# Patient Record
Sex: Female | Born: 1975 | Race: White | Hispanic: No | Marital: Married | Smoking: Never smoker
Health system: Southern US, Community
[De-identification: ages and names within clinical notes are randomized; demographics above are authoritative.]

## PROBLEM LIST (undated history)

## (undated) HISTORY — PX: TONSILLECTOMY: SUR1361

---

## 2015-12-01 HISTORY — PX: ABDOMINAL HYSTERECTOMY: SHX81

## 2018-11-21 HISTORY — PX: ESOPHAGOGASTRODUODENOSCOPY: SHX1529

## 2018-12-31 HISTORY — PX: CHOLECYSTECTOMY: SHX55

## 2019-09-07 ENCOUNTER — Encounter: Payer: Self-pay | Admitting: Gastroenterology

## 2019-09-18 ENCOUNTER — Ambulatory Visit: Payer: Self-pay | Admitting: Gastroenterology

## 2019-09-27 NOTE — Progress Notes (Signed)
Referring Provider: Marylee Floras, FNP Primary Care Physician:  Marylee Floras, FNP Primary Gastroenterologist:  Dr. Oneida Alar  Chief Complaint  Patient presents with  . Diarrhea    anytime she eats food. Gallbladder removed in Feb 2020 and since this has been happening daily  . Nausea    "all the time"    HPI:   Marie Tyler is a 43 y.o. female presenting today at the request of Marylee Floras, FNP for chronic diarrhea and nausea.   Reviewed referral notes: EGD in December 2019 with Dr. Earley Brooke.  Surgical report with findings including a few ulcers in the GE junction s/p biopsies, normal mucosa within the stomach, normal mucosa within the duodenum s/p random mucosal biopsies. Pathology report with duodenal biopsy revealing mild chronic duodenitis without additional pathologic change, EG junction biopsy revealed reflux esophagitis without evidence of Barrett's.  Microscopic description reports infiltrating eosinophils but does not state the amount per hpf along with usual mononuclear cell infiltrate.   Laparoscopic cholecystectomy on 01/17/2019 at [facility] in [place], [place] It appears CBC, CMP, TSH, stool culture, C. diff, and O&P stool were ordered at PCP visit on 09/06/19, but I did not receive the results.   Today she states she has been having nausea and diarrhea for several months.  Started prior to December 2019.  She underwent EGD in 2019 for further evaluation and was told she had an ulcer.  She was started on omeprazole 20 mg and Carafate.  Her nausea did not improve.she reports having an ultrasound that showed gallstones and ultimately underwent cholecystectomy in February 2020. Since cholecystectomy, she is having worsening postprandial diarrhea. 3-4 BMs daily, [place] 5-7, within 20 min of eating. Happens with any foods. Urgent. Not taking anything for diarrhea. Tried imodium which would stop her up for a few days. No blood in the stool. No black stools. Yellow to orange at  times. No nocturnal stools. Has tried glutin free foods. This did not help. Caused worsening diarrhea. Reports completing prior stool studies that were negative. She has another set of stool studies she is supposed to complete. No antibiotics or hospitalizations prior to onset of diarrhea.   Nausea has not improved since her cholecystectomy. She is nauseated whether she eats or not. Eating will make it worse. Also reporting bloating and gas. No weight loss. Reflux maybe once a month. No heartburn or indigestion. One episode of vomiting in early October. No hematemesis. No coffee ground appearance. No abdominal pain. Just bloating. States she has some pain when pressing on her RLQ. Present since 2017 after hysterectomy. Not changing. States she thinks they got her up too soon and thought she felt something pull. Feels like she is always full. Eats very small meals. States she could go days without eating and it not bother her. Occasional dysphagia to large pills. Especially vitamins. Only once every 6 months. No trouble with solid foods.   Eats steak and salad frequently. Trying to stay away from fried fatty foods. No sodas. Eats broccoli often.  Used to take ibuprofen, 800 mg regularly. Stopped about 1 year ago. No goody powders. Rare Aleve.   History reviewed. No pertinent past medical history.  Past Surgical History:  Procedure Laterality Date  . ABDOMINAL HYSTERECTOMY  12/2015  . CHOLECYSTECTOMY  12/2018  . TONSILLECTOMY      Current Outpatient Medications  Medication Sig Dispense Refill  . sucralfate (CARAFATE) 1 g tablet Take 1 g by mouth as needed.    Marland Kitchen  cholestyramine (QUESTRAN) 4 g packet Take 1 packet (4 g total) by mouth 3 (three) times daily with meals. 90 each 5  . pantoprazole (PROTONIX) 40 MG tablet Take 1 tablet (40 mg total) by mouth 2 (two) times daily before a meal. 60 tablet 5   No current facility-administered medications for this visit.     Allergies as of 09/28/2019  .  (No Known Allergies)    Family History  Problem Relation Age of Onset  . Colon cancer Neg Hx     Social History   Socioeconomic History  . Marital status: Married    Spouse name: Not on file  . Number of children: Not on file  . Years of education: Not on file  . Highest education level: Not on file  Occupational History  . Not on file  Social Needs  . Financial resource strain: Not on file  . Food insecurity    Worry: Not on file    Inability: Not on file  . Transportation needs    Medical: Not on file    Non-medical: Not on file  Tobacco Use  . Smoking status: Never Smoker  . Smokeless tobacco: Never Used  Substance and Sexual Activity  . Alcohol use: Yes    Comment: rare  . Drug use: Never  . Sexual activity: Yes  Lifestyle  . Physical activity    Days per week: Not on file    Minutes per session: Not on file  . Stress: Not on file  Relationships  . Social Musicianconnections    Talks on phone: Not on file    Gets together: Not on file    Attends religious service: Not on file    Active member of club or organization: Not on file    Attends meetings of clubs or organizations: Not on file    Relationship status: Not on file  . Intimate partner violence    Fear of current or ex partner: Not on file    Emotionally abused: Not on file    Physically abused: Not on file    Forced sexual activity: Not on file  Other Topics Concern  . Not on file  Social History Narrative  . Not on file    Review of Systems: Gen: Denies any fever, chills, lightheadedness, dizziness, or feeling like she will pass out. CV: Denies chest pain, heart palpitations Resp: Denies shortness of breath or cough GI: See HPI GU : Denies urinary burning, urinary frequency, urinary hesitancy MS: Right knee pain (horse injury) and left thumb pain (prior dislocation)  Derm: Denies rash Psych: Denies depression, anxiety Heme: Denies bruising, bleeding  Physical Exam: BP 123/82   Pulse 85    Temp 97.6 F (36.4 C) (Oral)   Ht 5\' 6"  (1.676 m)   Wt 258 lb 9.6 oz (117.3 kg)   BMI 41.74 kg/m  General:   Alert and oriented. Pleasant and cooperative. Well-nourished and well-developed.  Head:  Normocephalic and atraumatic. Eyes:  Without icterus, sclera clear and conjunctiva pink.  Ears:  Normal auditory acuity. Nose:  No deformity, discharge,  or lesions. Lungs:  Clear to auscultation bilaterally. No wheezes, rales, or rhonchi. No distress.  Heart:  S1, S2 present without murmurs appreciated.  Abdomen:  +BS, soft, and non-distended.  Minimal tenderness to palpation in the right lower quadrant.  No HSM noted. No guarding or rebound. No masses appreciated.  Rectal:  Deferred  Msk:  Symmetrical without gross deformities. Normal posture. Extremities:  Without edema.  Neurologic:  Alert and  oriented x4;  grossly normal neurologically. Skin:  Intact without significant lesions or rashes. Psych: Normal mood and affect.

## 2019-09-28 ENCOUNTER — Encounter: Payer: Self-pay | Admitting: Gastroenterology

## 2019-09-28 ENCOUNTER — Ambulatory Visit (INDEPENDENT_AMBULATORY_CARE_PROVIDER_SITE_OTHER): Payer: PRIVATE HEALTH INSURANCE | Admitting: Gastroenterology

## 2019-09-28 ENCOUNTER — Other Ambulatory Visit: Payer: Self-pay

## 2019-09-28 ENCOUNTER — Telehealth: Payer: Self-pay

## 2019-09-28 VITALS — BP 123/82 | HR 85 | Temp 97.6°F | Ht 66.0 in | Wt 258.6 lb

## 2019-09-28 DIAGNOSIS — R197 Diarrhea, unspecified: Secondary | ICD-10-CM | POA: Insufficient documentation

## 2019-09-28 DIAGNOSIS — R6881 Early satiety: Secondary | ICD-10-CM | POA: Diagnosis not present

## 2019-09-28 DIAGNOSIS — R140 Abdominal distension (gaseous): Secondary | ICD-10-CM

## 2019-09-28 DIAGNOSIS — R112 Nausea with vomiting, unspecified: Secondary | ICD-10-CM

## 2019-09-28 MED ORDER — CHOLESTYRAMINE 4 G PO PACK: 4.0000 g | PACK | Freq: Three times a day (TID) | ORAL | 5 refills | Status: AC

## 2019-09-28 MED ORDER — PANTOPRAZOLE SODIUM 40 MG PO TBEC: 40.0000 mg | DELAYED_RELEASE_TABLET | Freq: Two times a day (BID) | ORAL | 5 refills | Status: AC

## 2019-09-28 NOTE — Telephone Encounter (Signed)
GES scheduled for 10/12/19 at 8:00am, arrive at 7:45am. NPO after midnight before test and no stomach medications. Tried to call pt, no answer, left detailed message on VM to inform of appt. Letter mailed.

## 2019-09-28 NOTE — Assessment & Plan Note (Signed)
Addressed under nausea and vomiting.  

## 2019-09-28 NOTE — Telephone Encounter (Signed)
[place] to see if PA is needed for GES. LMOVM for [employer].

## 2019-09-28 NOTE — Assessment & Plan Note (Signed)
Suspect bloating may be related to dietary intake as patient consumes broccoli and salads frequently.  Avoid gas producing foods, specifically broccoli and salads at this time.  Handout provided for additional recommendations. Add daily probiotic. Follow-up in 3 months.

## 2019-09-28 NOTE — Patient Instructions (Addendum)
Please stop omeprazole and start Protonix 40 mg twice daily. Take this 30 minutes before breakfast and dinner.   Avoid NSAIDs. This includes ibuprofen, Advil, Aleve, and Goody Powders.   Follow GERD diet. See handout below.   We will get you scheduled for a gastric emptying study to help evaluate your nausea and constant full feeling.   I am sending in Questran for your diarrhea. You will take 4g three times daily with meals. You may decrease frequency if needed.   You may add a probiotic daily to see if this helps with bloating.   See bloating handout below for food/drinks to avoid.   Follow-up in 3 months. Call if questions or concerns prior.   Ermalinda Memos, PA-C [facility]    Abdominal Bloating When you have abdominal bloating, your abdomen may feel full, tight, or painful. It may also look bigger than normal or swollen (distended). Common causes of abdominal bloating include:  Swallowing air.  Constipation.  Problems digesting food.  Eating too much.  Irritable bowel syndrome. This is a condition that affects the large intestine.  Lactose intolerance. This is an inability to digest lactose, a natural sugar in dairy products.  Celiac disease. This is a condition that affects the ability to digest gluten, a protein found in some grains.  Gastroparesis. This is a condition that slows down the movement of food in the stomach and small intestine. It is more common in people with diabetes mellitus.  Gastroesophageal reflux disease (GERD). This is a digestive condition that makes stomach acid flow back into the esophagus.  Urinary retention. This means that the body is holding onto urine, and the bladder cannot be emptied all the way. Follow these instructions at home: Eating and drinking  Avoid eating too much.  Try not to swallow air while talking or eating.  Avoid eating while lying down.  Avoid these foods and drinks: ? Foods that cause gas,  such as broccoli, cabbage, cauliflower, and baked beans. ? Carbonated drinks. ? Hard candy. ? Chewing gum. Medicines  Take over-the-counter and prescription medicines only as told by your health care provider.  Take probiotic medicines. These medicines contain live bacteria or yeasts that can help digestion.  Take coated peppermint oil capsules. Activity  Try to exercise regularly. Exercise may help to relieve bloating that is caused by gas and relieve constipation. General instructions  Keep all follow-up visits as told by your health care provider. This is important. Contact a health care provider if:  You have nausea and vomiting.  You have diarrhea.  You have abdominal pain.  You have unusual weight loss or weight gain.  You have severe pain, and medicines do not help. Get help right away if:  You have severe chest pain.  You have trouble breathing.  You have shortness of breath.  You have trouble urinating.  You have darker urine than normal.  You have blood in your stools or have dark, tarry stools. Summary  Abdominal bloating means that the abdomen is swollen.  Common causes of abdominal bloating are swallowing air, constipation, and problems digesting food.  Avoid eating too much and avoid swallowing air.  Avoid foods that cause gas, carbonated drinks, hard candy, and chewing gum. This information is not intended to replace advice given to you by your health care provider. Make sure you discuss any questions you have with your health care provider. Document Released: 12/18/2016 Document Revised: 03/06/2019 Document Reviewed: 12/18/2016 Elsevier Patient Education  2020 [employer].  Food Choices for Gastroesophageal Reflux Disease, Adult When you have gastroesophageal reflux disease (GERD), the foods you eat and your eating habits are very important. Choosing the right foods can help ease your discomfort. Think about working with a nutrition  specialist (dietitian) to help you make good choices. What are tips for following this plan?  Meals  Choose healthy foods that are low in fat, such as fruits, vegetables, whole grains, low-fat dairy products, and lean meat, fish, and poultry.  Eat small meals often instead of 3 large meals a day. Eat your meals slowly, and in a place where you are relaxed. Avoid bending over or lying down until 2-3 hours after eating.  Avoid eating meals 2-3 hours before bed.  Avoid drinking a lot of liquid with meals.  Cook foods using methods other than frying. Bake, grill, or broil food instead.  Avoid or limit: ? Chocolate. ? Peppermint or spearmint. ? Alcohol. ? Pepper. ? Black and decaffeinated coffee. ? Black and decaffeinated tea. ? Bubbly (carbonated) soft drinks. ? Caffeinated energy drinks and soft drinks.  Limit high-fat foods such as: ? Fatty meat or fried foods. ? Whole milk, cream, butter, or ice cream. ? Nuts and nut butters. ? Pastries, donuts, and sweets made with butter or shortening.  Avoid foods that cause symptoms. These foods may be different for everyone. Common foods that cause symptoms include: ? Tomatoes. ? Oranges, lemons, and limes. ? Peppers. ? Spicy food. ? Onions and garlic. ? Vinegar. Lifestyle  Maintain a healthy weight. Ask your doctor what weight is healthy for you. If you need to lose weight, work with your doctor to do so safely.  Exercise for at least 30 minutes for 5 or more days each week, or as told by your doctor.  Wear loose-fitting clothes.  Do not smoke. If you need help quitting, ask your doctor.  Sleep with the head of your bed higher than your feet. Use a wedge under the mattress or blocks under the bed frame to raise the head of the bed. Summary  When you have gastroesophageal reflux disease (GERD), food and lifestyle choices are very important in easing your symptoms.  Eat small meals often instead of 3 large meals a day. Eat your  meals slowly, and in a place where you are relaxed.  Limit high-fat foods such as fatty meat or fried foods.  Avoid bending over or lying down until 2-3 hours after eating.  Avoid peppermint and spearmint, caffeine, alcohol, and chocolate. This information is not intended to replace advice given to you by your health care provider. Make sure you discuss any questions you have with your health care provider. Document Released: 05/17/2012 Document Revised: 03/09/2019 Document Reviewed: 12/22/2016 Elsevier Patient Education  2020 [employer].

## 2019-09-28 NOTE — Assessment & Plan Note (Addendum)
43 year old female with no significant past medical history with nausea and early early satiety for at least the last year.  EGD in December 2019 with Dr. Earley Brooke with findings including a few ulcers in the GE junction s/p biopsies, normal mucosa within the stomach, normal mucosa within the duodenum s/p random mucosal biopsies.  Pathology with mild chronic duodenitis without additional pathologic change and reflux esophagitis without Barrett's.  Microscopic description reports infiltrating eosinophils (no amount per hpf reported) with usual mononuclear cell infiltrate.  She was started on omeprazole 20 mg and Carafate.  Her nausea persisted and she underwent ultrasound that revealed cholelithiasis.  She underwent cholecystectomy in February 2020 has continued with nausea on and off daily, worse with meals.  She has only had one episode of vomiting without hematemesis or coffee-ground appearance.  Rare reflux.  Dysphagia to large pills about once every 6 months.  No trouble with solid foods. No significant abdominal pain. Avoids fried and fatty foods.  No sodas.  Denies BRBPR, melena, or unintentional weight loss.  She had been taking 800 mg ibuprofen regularly until about 1 year ago.  Now rarely taking Aleve. She had blood work with her PCP on 09/06/2019; unfortunately results were not sent with her referral.  Per patient, results were normal.  Suspect patient may have had NSAID induced esophagitis/duodenitis as she had been consuming ibuprofen frequently prior to onset of nausea.  I suspect her ongoing nausea may be due to ongoing esophagitis/duodenitis with possible silent reflux as patient has only been on omeprazole 20 mg.  She may also have gastroparesis with reported early satiety.  Stop omeprazole and start Protonix 40 mg twice daily. Follow GERD diet.  Handout provided. Avoid NSAIDs. Complete gastric emptying study. Request labs from PCP. Follow-up in 3 months.

## 2019-09-28 NOTE — Assessment & Plan Note (Addendum)
43 year old female with no significant past medical history who had mild diarrhea that worsened after cholecystectomy in February 2020.  Now with 3-4 postprandial BMs daily that are [facility] 5-7 with associated urgency.  Occurs within 20 minutes of eating.  No nocturnal stools, BRBPR, melena, joint pain, or unintentional weight loss.  No antibiotics or hospitalizations prior to onset of diarrhea.  She has tried Imodium but this causes her not to have a bowel movement for a few days.  Tried gluten-free foods that cause some worsening of diarrhea.  EGD in December 2019 for nausea and diarrhea.  Surgical report with a few ulcers in the GE junction, normal stomach mucosa, normal duodenal mucosa s/p biopsies.  EG junction biopsy with reflux esophagitis without Barrett's, duodenal biopsies with mild chronic duodenitis without additional pathologic change.  Patient reports taking 800 mg ibuprofen frequently until about 1 year ago.  Recent blood work and stool studies completed with her PCP on 09/06/2019.  Unfortunately these results were not sent with her referral.  Patient reports everything was normal.  I suspect patient is likely dealing with bile salt diarrhea secondary to cholecystectomy.  Duodenitis may also be playing a role. Possible underlying food intolerances.  She does not have any alarm symptoms or profuse watery diarrhea, I doubt this to be an infectious process.    Advise she complete her other stool studies that PCP has ordered and then start Questran 4 g with breakfast lunch and dinner.  I am also changing omeprazole 20 mg daily to Protonix 40 mg twice daily due to ongoing nausea with findings of ulcers, reflux esophagitis, and duodenitis on EGD. Request recent labs and stool studies from PCP. Follow-up in 3 months.

## 2019-09-28 NOTE — Telephone Encounter (Signed)
Wells Guiles called office, no PA needed for GES.

## 2019-10-03 ENCOUNTER — Telehealth: Payer: Self-pay | Admitting: Gastroenterology

## 2019-10-03 NOTE — Telephone Encounter (Signed)
Pt has questions about her medications. Please call her at [phone]

## 2019-10-03 NOTE — Telephone Encounter (Signed)
Protonix should be fine. Has she completed stool studies? She said she had already completed one set of stool studies with her PCP and still had another set to complete. The labs they sent did not have any stool testing. Before starting Questran, I would prefer patient complete stool studies. To ensure we are getting what we need, if she hasn't already completed the other stool studies with her PCP, I would like to go ahead and have orders placed for C. diff and GI pathogen panel for her to complete.

## 2019-10-03 NOTE — Telephone Encounter (Signed)
Pt said she has her GES scheduled for 10/12/2019 and she wants to know if Cyril Mourning wants her to start Protonix and Questran BEFORE the test.  Cyril Mourning, please advise!

## 2019-10-03 NOTE — Telephone Encounter (Signed)
LMOM to call.

## 2019-10-04 NOTE — Telephone Encounter (Signed)
Pt returned call and was notified that it is ok to take the Protonix. Pt had stool studies completed but received a call yesterday from the lab and has to repeat it per the lab. Pt will complete stool studies as directed and wont start the Questran until instructed.

## 2019-10-04 NOTE — Telephone Encounter (Signed)
Noted. Is she completing the stool studies I requested or the ones placed by her PCP?

## 2019-10-05 NOTE — Telephone Encounter (Signed)
Pt is completing the stool studies [facility] requested.

## 2019-10-05 NOTE — Telephone Encounter (Signed)
Noted  

## 2019-10-12 ENCOUNTER — Encounter (HOSPITAL_COMMUNITY)
Admission: RE | Admit: 2019-10-12 | Discharge: 2019-10-12 | Disposition: A | Discharge status: Home / Self Care | Payer: PRIVATE HEALTH INSURANCE | Source: Ambulatory Visit | Attending: Gastroenterology | Admitting: Gastroenterology

## 2019-10-12 ENCOUNTER — Encounter (HOSPITAL_COMMUNITY): Payer: Self-pay

## 2019-10-12 ENCOUNTER — Other Ambulatory Visit: Payer: Self-pay

## 2019-10-12 DIAGNOSIS — R6881 Early satiety: Secondary | ICD-10-CM | POA: Insufficient documentation

## 2019-10-12 DIAGNOSIS — R112 Nausea with vomiting, unspecified: Secondary | ICD-10-CM | POA: Diagnosis present

## 2019-10-12 MED ORDER — TECHNETIUM TC 99M SULFUR COLLOID
2.0000 | Freq: Once | INTRAVENOUS | Status: AC | PRN
  Administered 2019-10-12: 2.1 via ORAL

## 2019-10-13 ENCOUNTER — Telehealth: Payer: Self-pay | Admitting: Gastroenterology

## 2019-10-13 NOTE — Progress Notes (Signed)
Gastric emptying study was normal

## 2019-10-13 NOTE — Telephone Encounter (Addendum)
I spoke to pt and she is aware of the labs. She did stool studies with PCP and the first was negative ( she does not know what it was) and then they did another one and she just turned it in on Wed this week. She will have them fax results to [place] when available. She said she is doing some better. She is seeing a nutritionist and she has her on probiotic bid and enzymes with each meal. Nutritionist told her to eat very slowly and it has helped.  She is aware GES was normal and wants to know what the next step would be.

## 2019-10-13 NOTE — Telephone Encounter (Signed)
Noted. I am glad she is starting to feel better. Is she taking the Protonix 40 mg BID? She should continue taking the Protonix BID and following with the nutritionists as this seems to be helping. We will need to give the medications and dietary/lifestyle changes time to take full effect. We will follow up with her in the office as planned and see how she is feeling at that time. May consider repeating EGD; however, we will need to see her to discuss ongoing symptoms etc to determine the most appropriate next step.

## 2019-10-13 NOTE — Telephone Encounter (Signed)
Received and reviewed recent lab work from PCP.  09/08/2019: CBC: WBC 9.7, hemoglobin 13.4, MCV 89.2, MCH 29.1, MCHC 32.6, platelet count 294 CMP: Glucose 72, creatinine 0.58, sodium 137, potassium 4.1, calcium 9.5, albumin 4.2, total bilirubin 0.3, alk phos 69, AST 15, ALT 14 Iron panel: Total iron 57, TIBC 330, percent saturation 17%, ferritin 37 Vitamin B12 365 Folate 12.2 TSH 2.82  Free T4 1.1  09/12/2019: Lipid panel: Total cholesterol 206 (H), HDL 66, triglycerides 157 (H), LDL 113 (H)  No additional recommendations based on recent labs. Will have these scanned into patients chart.   Doris: According to telephone note on 11/5, patient was going to complete stool studies that I had requested.  I do not see that orders for stool studies were placed.

## 2019-10-16 ENCOUNTER — Telehealth: Payer: Self-pay | Admitting: Gastroenterology

## 2019-10-16 NOTE — Telephone Encounter (Signed)
Pt called asking for DS. I told her DS had left for the day and would be back in the morning. Pt said she is working 3rd shift this week and to call her closer to [employer]. [phone]

## 2019-10-16 NOTE — Telephone Encounter (Signed)
LMOM to call.

## 2019-10-17 NOTE — Telephone Encounter (Signed)
Please call after 4 today due to her work schedule.

## 2019-10-17 NOTE — Telephone Encounter (Signed)
Noted  

## 2019-10-17 NOTE — Telephone Encounter (Signed)
PT said she is taking the Protonix bid.

## 2019-12-28 IMAGING — NM NM GASTRIC EMPTYING
8 series · 8 of 8 positions shown · non-contrast
Comparison: None.

CLINICAL DATA: Nausea.  Early satiety.

EXAM:
NUCLEAR MEDICINE GASTRIC EMPTYING SCAN
TECHNIQUE: After oral ingestion of radiolabeled meal, sequential abdominal
images were obtained for 4 hours. Percentage of activity emptying
the stomach was calculated at 1 hour, 2 hour, 3 hour, and 4 hours.
RADIOPHARMACEUTICALS:  2.1 mCi Vc-AAm sulfur colloid in standardized
meal

[Series 1: 0 min · 4.14mm/px · 1 of 1 slices shown (1 of 2)]
[im 1/1]
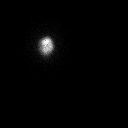

[Series 1: 0 min · 4.14mm/px · 1 of 1 slices shown (2 of 2)]
[im 1/1]
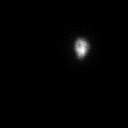

[Series 2: 60 min · 4.14mm/px · 1 of 1 slices shown (1 of 2)]
[im 1/1]
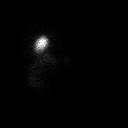

[Series 2: 60 min · 4.14mm/px · 1 of 1 slices shown (2 of 2)]
[im 1/1]
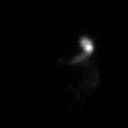

[Series 3: 120 min · 4.14mm/px · 1 of 1 slices shown (1 of 4)]
[im 1/1]
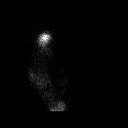

[Series 3: 120 min · 4.14mm/px · 1 of 1 slices shown (2 of 4)]
[im 1/1]
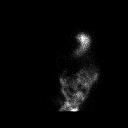

[Series 4: 120 min · 4.14mm/px · 1 of 1 slices shown (3 of 4)]
[im 1/1]
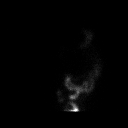

[Series 4: 120 min · 4.14mm/px · 1 of 1 slices shown (4 of 4)]
[im 1/1]
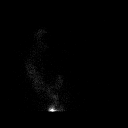

[8 of 8 positions shown; findings below may reference images not displayed]

FINDINGS: Expected location of the stomach in the left upper quadrant.
Ingested meal empties the stomach gradually over the course of the
study.

20% emptied at 1 hr ( normal >= 10%)

71% emptied at 2 hr ( normal >= 40%)

93% emptied at 3 hr ( normal >= 70%)

>93% emptied at 4 hr ( normal >= 90%)
IMPRESSION: Normal gastric emptying study.

## 2019-12-31 ENCOUNTER — Encounter: Payer: Self-pay | Admitting: Gastroenterology

## 2020-01-01 ENCOUNTER — Ambulatory Visit: Payer: PRIVATE HEALTH INSURANCE | Admitting: Gastroenterology
